# Patient Record
Sex: Male | Born: 1970 | Race: Black or African American | Hispanic: No | Marital: Single | Smoking: Current some day smoker
Health system: Southern US, Community
[De-identification: ages and names within clinical notes are randomized; demographics above are authoritative.]

---

## 2016-06-10 ENCOUNTER — Emergency Department (HOSPITAL_COMMUNITY): Payer: Medicaid - Out of State

## 2016-06-10 ENCOUNTER — Emergency Department (HOSPITAL_COMMUNITY)
Admission: EM | Admit: 2016-06-10 | Discharge: 2016-06-11 | Disposition: A | Discharge status: Home / Self Care | Payer: Medicaid - Out of State | Attending: Emergency Medicine | Admitting: Emergency Medicine

## 2016-06-10 ENCOUNTER — Encounter (HOSPITAL_COMMUNITY): Payer: Self-pay | Admitting: *Deleted

## 2016-06-10 DIAGNOSIS — R519 Headache, unspecified: Secondary | ICD-10-CM

## 2016-06-10 DIAGNOSIS — R51 Headache: Secondary | ICD-10-CM | POA: Diagnosis not present

## 2016-06-10 DIAGNOSIS — F17210 Nicotine dependence, cigarettes, uncomplicated: Secondary | ICD-10-CM | POA: Diagnosis not present

## 2016-06-10 LAB — BASIC METABOLIC PANEL
ANION GAP: 5 (ref 5–15)
BUN: 8 mg/dL (ref 6–20)
CO2: 27 mmol/L (ref 22–32)
Calcium: 9 mg/dL (ref 8.9–10.3)
Chloride: 106 mmol/L (ref 101–111)
Creatinine, Ser: 1.03 mg/dL (ref 0.61–1.24)
GFR calc Af Amer: >60 mL/min (ref >60)
Glucose, Bld: 101 mg/dL — ABNORMAL HIGH (ref 65–99)
POTASSIUM: 3.7 mmol/L (ref 3.5–5.1)
SODIUM: 138 mmol/L (ref 135–145)

## 2016-06-10 LAB — CBC WITH DIFFERENTIAL/PLATELET
BASOS ABS: 0 10*3/uL (ref 0.0–0.1)
Basophils Relative: 1 %
EOS PCT: 6 %
Eosinophils Absolute: 0.3 10*3/uL (ref 0.0–0.7)
HCT: 43 % (ref 39.0–52.0)
Hemoglobin: 14.1 g/dL (ref 13.0–17.0)
LYMPHS PCT: 41 %
Lymphs Abs: 2 10*3/uL (ref 0.7–4.0)
MCH: 27.3 pg (ref 26.0–34.0)
MCHC: 32.8 g/dL (ref 30.0–36.0)
MCV: 83.2 fL (ref 78.0–100.0)
Monocytes Absolute: 0.5 10*3/uL (ref 0.1–1.0)
Monocytes Relative: 11 %
NEUTROS ABS: 2 10*3/uL (ref 1.7–7.7)
Neutrophils Relative %: 41 %
PLATELETS: 230 10*3/uL (ref 150–400)
RBC: 5.17 MIL/uL (ref 4.22–5.81)
RDW: 14 % (ref 11.5–15.5)
WBC: 4.9 10*3/uL (ref 4.0–10.5)

## 2016-06-10 LAB — CBG MONITORING, ED: GLUCOSE-CAPILLARY: 94 mg/dL (ref 65–99)

## 2016-06-10 NOTE — ED Triage Notes (Signed)
Pt reports having migraine headaches intermittent over past few days, returned today and reports sharp pain above right eye. Denies sensitivity to light or n/v.

## 2016-06-10 NOTE — ED Provider Notes (Signed)
[facility]-EMERGENCY DEPT Provider Note   CSN: [id number] Arrival date & time: 06/10/16  1806  By signing my name below, I, Harold Mullen, attest that this documentation has been prepared under the direction and in the presence of non-physician practitioner, Harold FowlerKayla Thaddus Mcdowell, PA-C. Electronically Signed: Freida Busmaniana Mullen, Scribe. 06/10/2016. 12:47 AM.  History   Chief Complaint Chief Complaint  Patient presents with  . Migraine   The history is provided by the patient. No language interpreter was used.    HPI Comments:  Harold Mullen is a 45 y.o. male who presents to the Emergency Department complaining of a HA x ~6 days. He reports persistent throbbing  for the first 4 days but states yesterday he had an "attack" of sharp pain over the right eye that lasted for ~ 15 minutes and resolved on its own. He had a second episode of sharp pain yesterday evening around the same time as the previous attack. Pt states his pain has improved at this time. Pt notes associated photophobia. He denies eye swelling/drainage/watering. No recent head injury/trauma or fall.  He denies double vision, numbness/weakness, phonophobia, fever, neck pain/stiffness, nausea, and vomiting. He states he has had HAs in the past, but pain today is dissimilar. He notes driving with one contact in his eye which caused him to strain quite a bit prior to symptom onset. No alleviating factors noted; no treatments tried. No family history of brain aneurysm or sudden death.   History reviewed. No pertinent past medical history.  There are no active problems to display for this patient.   History reviewed. No pertinent surgical history.     Home Medications    Prior to Admission medications   Medication Sig Start Date End Date Taking? Authorizing Provider  naproxen (NAPROSYN) 500 MG tablet Take 1 tablet (500 mg total) by mouth 2 (two) times daily. 06/11/16   Harold FowlerKayla Iley Deignan, PA-C    Family History History reviewed. No pertinent family  history.  Social History Social History  Substance Use Topics  . Smoking status: Current Some Day Smoker    Types: Cigars  . Smokeless tobacco: Never Used  . Alcohol use Yes     Comment: occ     Allergies   Review of patient's allergies indicates no known allergies.   Review of Systems Review of Systems  Constitutional: Negative for fever.  Eyes: Positive for photophobia. Negative for visual disturbance.  Musculoskeletal: Negative for neck pain and neck stiffness.  Neurological: Positive for headaches. Negative for syncope.     Physical Exam Updated Vital Signs BP 144/82 (BP Location: Left Arm)   Pulse 66   Temp 98 F (36.7 C) (Oral)   Resp 16   SpO2 99%   Physical Exam  Constitutional: He is oriented to person, place, and time. He appears well-developed and well-nourished.  Non-toxic appearance. He does not have a sickly appearance. He does not appear ill.  HENT:  Head: Normocephalic and atraumatic.  Mouth/Throat: Oropharynx is clear and moist.  Eyes: Conjunctivae are normal. Pupils are equal, round, and reactive to light.  Neck: Normal range of motion. Neck supple.  No meningismus.   Cardiovascular: Normal rate and regular rhythm.   Pulmonary/Chest: Effort normal and breath sounds normal. No accessory muscle usage or stridor. No respiratory distress. He has no wheezes. He has no rhonchi. He has no rales.  Abdominal: Soft. Bowel sounds are normal. He exhibits no distension. There is no tenderness.  Musculoskeletal: Normal range of motion.  Lymphadenopathy:  He has no cervical adenopathy.  Neurological: He is alert and oriented to person, place, and time.  Mental Status:   AOx3.  Speech clear without dysarthria. Cranial Nerves:  I-not tested  II-PERRLA  III, IV, VI-EOMs intact  V-temporal and masseter strength intact  VII-symmetrical facial movements intact, no facial droop  VIII-hearing grossly intact bilaterally  IX, X-gag intact  XI-strength of  sternomastoid and trapezius muscles 5/5  XII-tongue midline Motor:   Good muscle bulk and tone  Strength 5/5 bilaterally in upper and lower extremities   Cerebellar--intact RAMs, finger to nose intact bilaterally.  Gait normal  No pronator drift Sensory:  Intact in upper and lower extremities   Skin: Skin is warm and dry.  Psychiatric: He has a normal mood and affect. His behavior is normal.  Nursing note and vitals reviewed.    ED Treatments / Results  DIAGNOSTIC STUDIES:  Oxygen Saturation is 99% on RA, normal by my interpretation.    COORDINATION OF CARE:  10:04 PM Pt offered pain meds which he declined at this time. Pt agreeable to head CT.   Labs (all labs ordered are listed, but only abnormal results are displayed) Labs Reviewed  BASIC METABOLIC PANEL - Abnormal; Notable for the following:       Result Value   Glucose, Bld 101 (*)    All other components within normal limits  CBC WITH DIFFERENTIAL/PLATELET  CBG MONITORING, ED    EKG  EKG Interpretation None       Radiology Ct Head Wo Contrast  Result Date: 06/11/2016 CLINICAL DATA:  45 y/o M; Headaches over the past few days with sharp pain over the right eye. EXAM: CT HEAD WITHOUT CONTRAST TECHNIQUE: Contiguous axial images were obtained from the base of the skull through the vertex without intravenous contrast. COMPARISON:  None. FINDINGS: Brain: No evidence of acute infarction, hemorrhage, hydrocephalus, extra-axial collection or mass lesion/mass effect. Vascular: No hyperdense vessel or unexpected calcification. Skull: No acute fracture is identified. Sinuses/Orbits: Right greater than left maxillary sinus mucosal thickening and partial opacification of the right frontal sinus and right anterior ethmoid air cells. Chronic appearing right sided lamina papyracea fracture with herniation of fat and medial rectus muscle. Mastoid air cells are normally aerated. Globes are unremarkable. Other: None. IMPRESSION: 1. No  acute intracranial abnormality is identified. 2. Predominantly right frontal and maxillary sinus paranasal sinus disease. 3. 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 Electronically Signed   By: Mitzi HansenLance  Furusawa-Stratton M.D.   On: 06/11/2016 00:13    Procedures Procedures (including critical care time)  Medications Ordered in ED Medications  ketorolac (TORADOL) injection 60 mg (not administered)     Initial Impression / Assessment and Plan / ED Course  I have reviewed the triage vital signs and the nursing notes.  Pertinent labs & imaging results that were available during my care of the patient were reviewed by me and considered in my medical decision making (see chart for details).  Clinical Course   Patient with hx of headaches; however this one different.  VSS, NAD.  No fever, N/V, neck stiffiness.  No injury/trauma.  No medications.  No family history of brain aneurysm.  No focal neurological deficits.  No meningismus.  Do not suspect SAH, meningitis, ICH, NPH.  Suspect primary headache possibly related to recent eye strain due to not wearing contacts.  However, will obtain head CT and basic labs due to differences from previous HAs.  He declines any pain medicine at  this time.   Labs without acute abnormalities. CT head showed no acute abnormalities. He does have right frontal and actually sinus disease. However he is asymptomatic. It does show a chronic right orbit lamina papyracea fracture deformity. He has had no head injury or trauma. He has normal EOMs. Close follow-up with ophthalmology. Patient given IM Toradol in ED. Home with Naprosyn. Return precautions discussed. Patient agrees and acknowledges the above plan for discharge.     Final Clinical Impressions(s) / ED Diagnoses   Final diagnoses:  Headache, unspecified headache type    New Prescriptions New Prescriptions   NAPROXEN (NAPROSYN) 500 MG TABLET    Take 1 tablet (500 mg total) by mouth 2  (two) times daily.   I personally performed the services described in this documentation, which was scribed in my presence. The recorded information has been reviewed and is accurate.     Harold Fowler, PA-C 06/11/16 0981    Lavera Guise, MD 06/11/16 [phone]

## 2016-06-10 NOTE — ED Notes (Signed)
Offered pt pain meds at triage but pt declined. 

## 2016-06-11 MED ORDER — KETOROLAC TROMETHAMINE 60 MG/2ML IM SOLN
60.0000 mg | Freq: Once | INTRAMUSCULAR | Status: AC
  Administered 2016-06-11: 60 mg via INTRAMUSCULAR
  Filled 2016-06-11: qty 2

## 2016-06-11 MED ORDER — NAPROXEN 500 MG PO TABS: 500.0000 mg | ORAL_TABLET | Freq: Two times a day (BID) | ORAL | 0 refills | Status: AC

## 2016-06-11 NOTE — ED Notes (Signed)
Patient Alert and oriented X4. Stable and ambulatory. Patient verbalized understanding of the discharge instructions.  Patient belongings were taken by the patient.  

## 2016-06-11 NOTE — Discharge Instructions (Signed)
Your CT today did not show any acute abnormalities. It did show a chronic right orbit lamina papyracea fracture.  This can be followed up with ophthalmology.

## 2017-07-06 IMAGING — CT CT HEAD W/O CM
3 series · 15 of 47 positions shown, 18 images · non-contrast
Comparison: None.

CLINICAL DATA: 45 y/o M; Headaches over the past few days with
sharp pain over the right eye.

EXAM:
CT HEAD WITHOUT CONTRAST
TECHNIQUE: Contiguous axial images were obtained from the base of the skull
through the vertex without intravenous contrast.

[Series 2: head 5.0 h30s · axial · 0.47mm/px · z∈[-143,+12]mm · 9 of 37 slices shown, 12 images]
[im 3/37  brain]
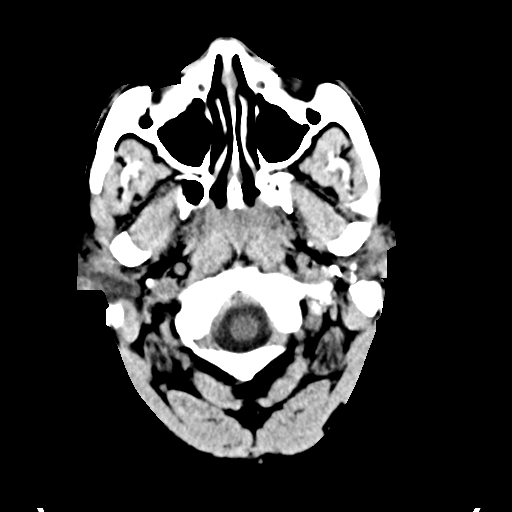
[im 3/37  bone]
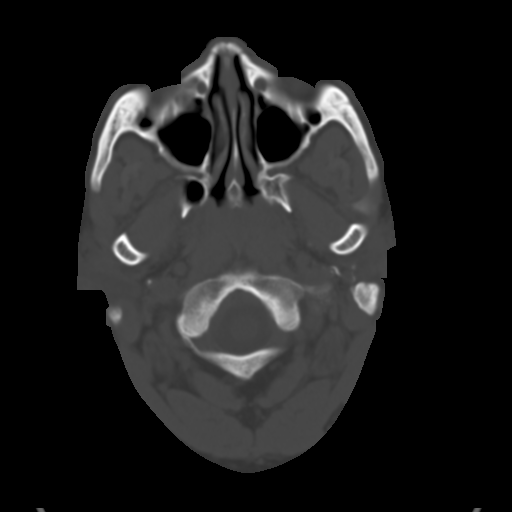
[im 7/37  brain]
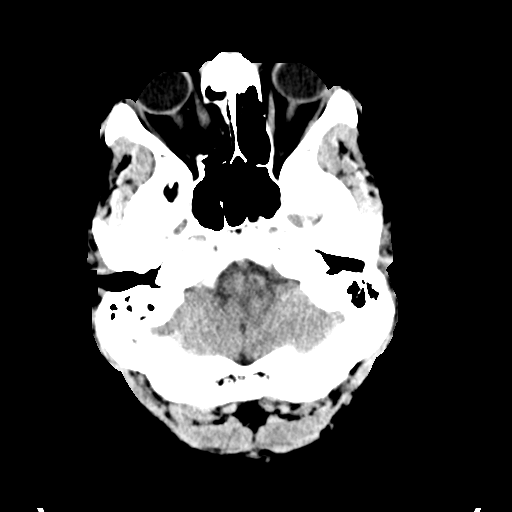
[im 10/37  brain]
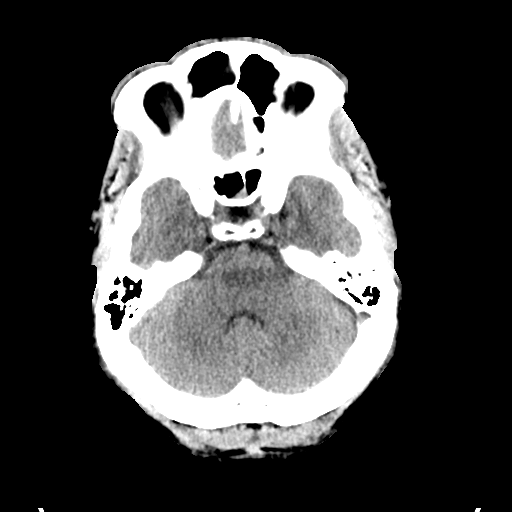
[im 14/37  brain]
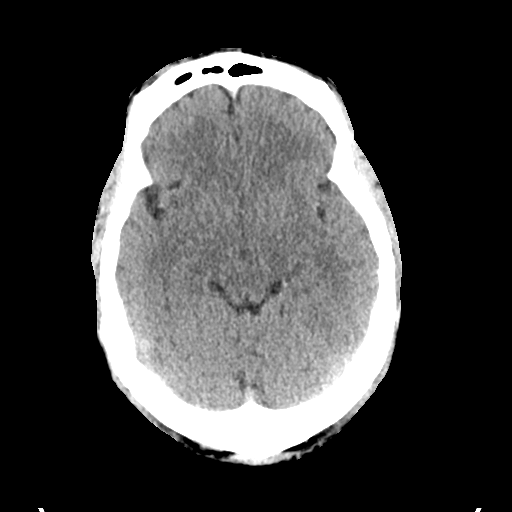
[im 19/37  brain]
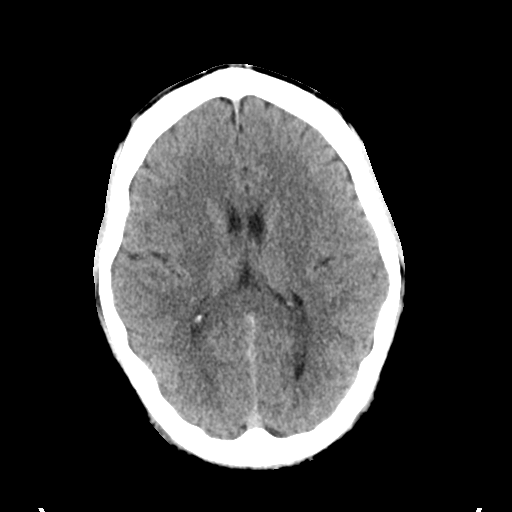
[im 19/37  bone]
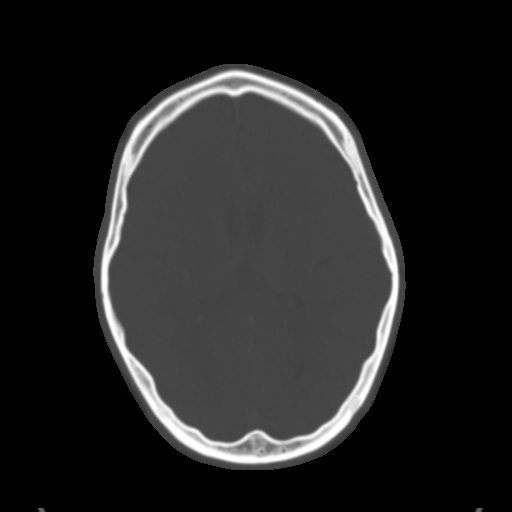
[im 23/37  brain]
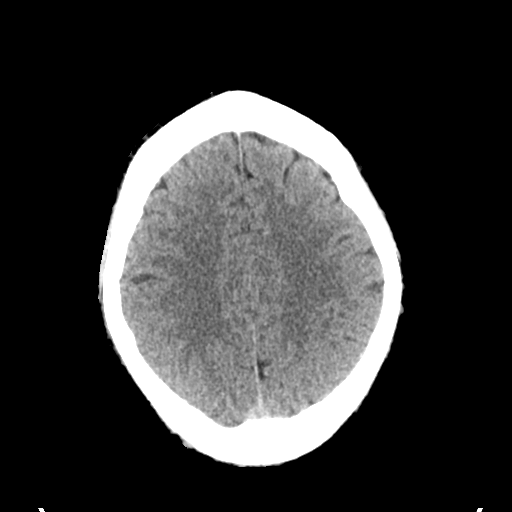
[im 27/37  brain]
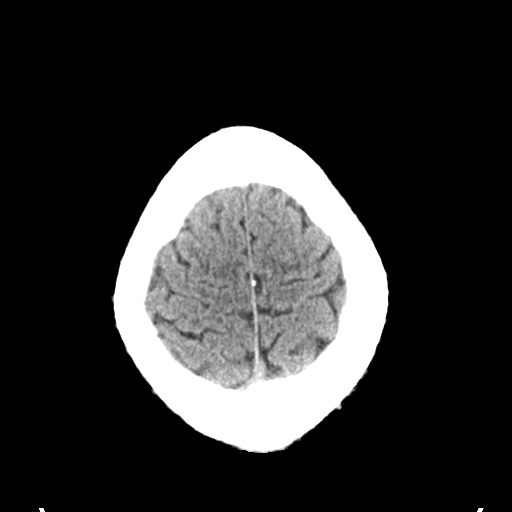
[im 30/37  brain]
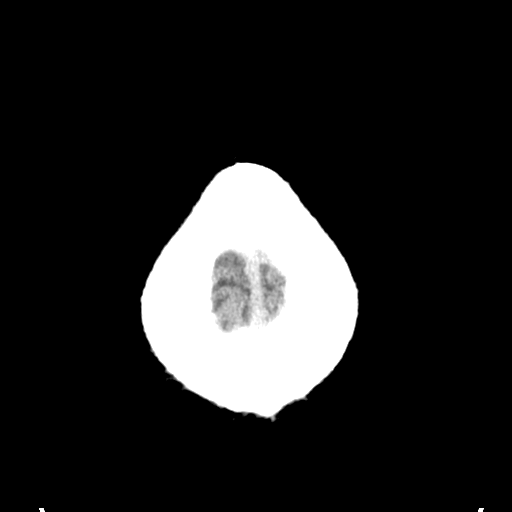
[im 34/37  brain]
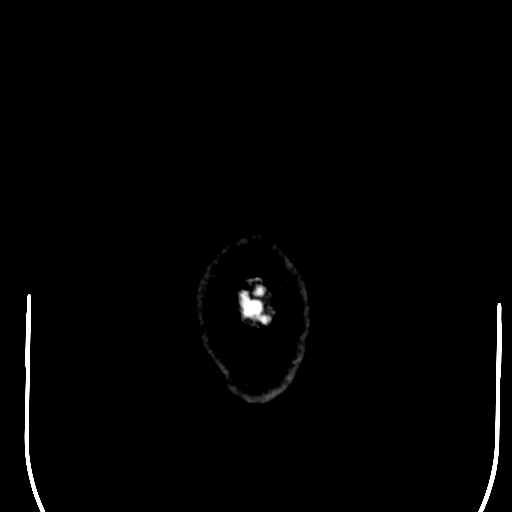
[im 34/37  bone]
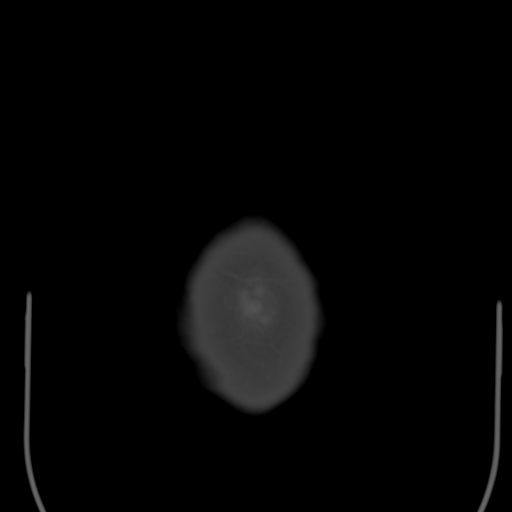

[Series 4: head 3.0 mpr · coronal · 0.35mm/px · 3 of 77 slices shown (1 of 2)]
[im 26/77  brain]
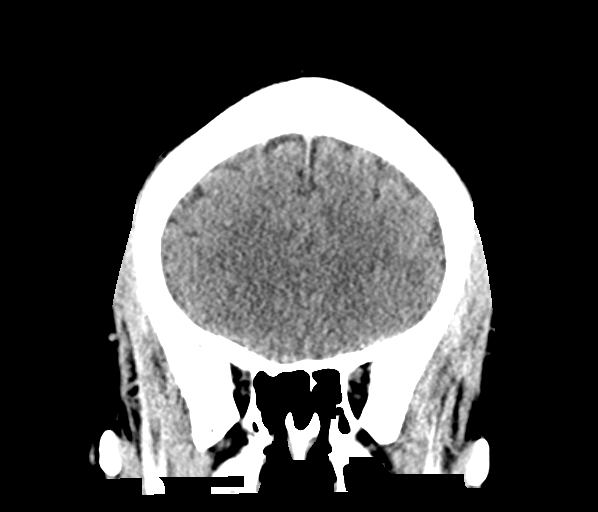
[im 34/77  brain]
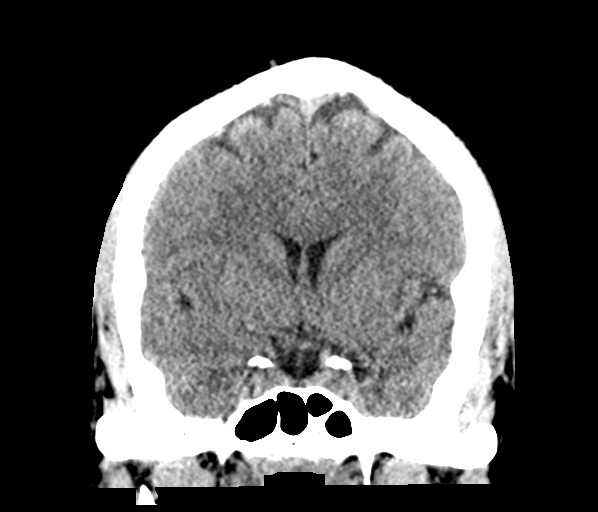
[im 43/77  brain]
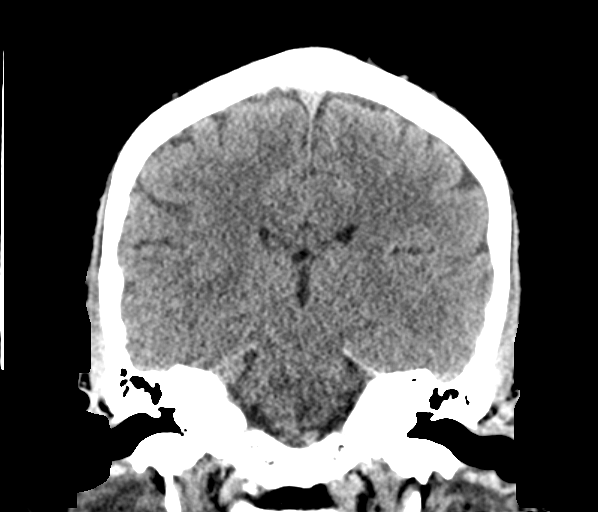

[Series 5: head 3.0 mpr · sagittal · 0.34mm/px · 3 of 67 slices shown (2 of 2)]
[im 23/67  brain]
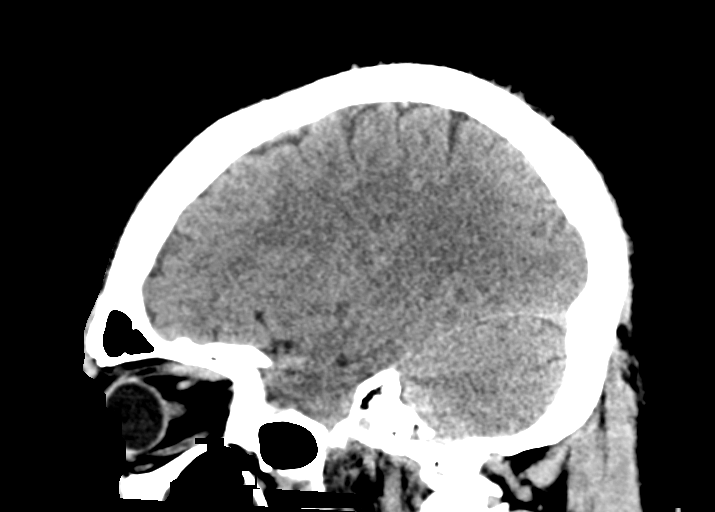
[im 34/67  brain]
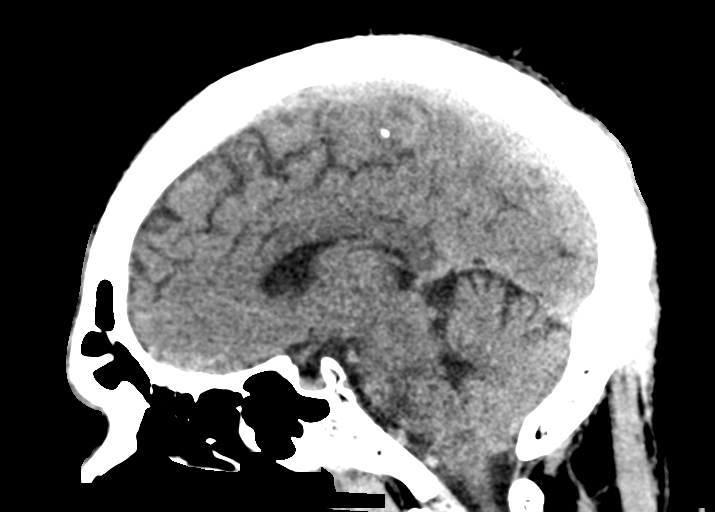
[im 45/67  brain]
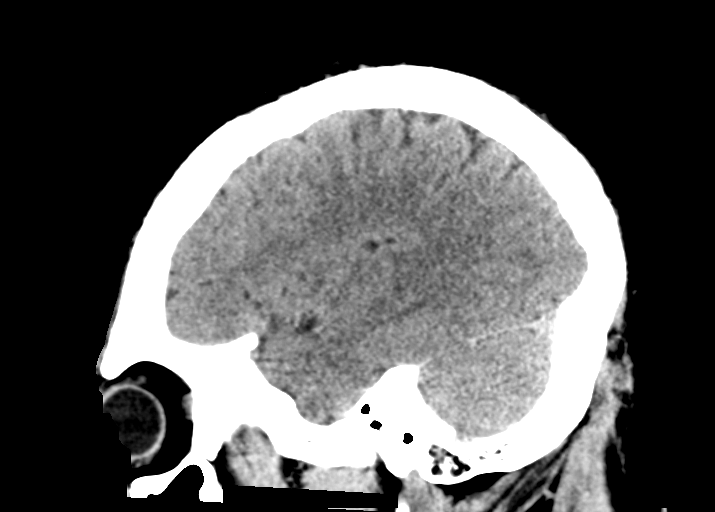

[15 of 47 positions shown; findings below may reference images not displayed]

FINDINGS: Brain: No evidence of acute infarction, hemorrhage, hydrocephalus,
extra-axial collection or mass lesion/mass effect.

Vascular: No hyperdense vessel or unexpected calcification.

Skull: No acute fracture is identified.

Sinuses/Orbits: Right greater than left maxillary sinus mucosal
thickening and partial opacification of the right frontal sinus and
right anterior ethmoid air cells. Chronic appearing right sided
lamina papyracea fracture with herniation of fat and medial rectus
muscle. Mastoid air cells are normally aerated. Globes are
unremarkable.

Other: None.
IMPRESSION: 1. No acute intracranial abnormality is identified.
2. Predominantly right frontal and maxillary sinus paranasal sinus
disease.
3. Chronic right orbit lamina papyracea see a fracture deformity.

By: Hana Marija Belaev M.D.
